# Patient Record
Sex: Male | Born: 2000 | Race: Black or African American | Hispanic: No | Marital: Single | Smoking: Never smoker
Health system: Southern US, Community
[De-identification: ages and names within clinical notes are randomized; demographics above are authoritative.]

## PROBLEM LIST (undated history)

## (undated) HISTORY — PX: HERNIA REPAIR: SHX51

---
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
## 2022-05-31 ENCOUNTER — Emergency Department (HOSPITAL_BASED_OUTPATIENT_CLINIC_OR_DEPARTMENT_OTHER)
Admission: EM | Admit: 2022-05-31 | Discharge: 2022-05-31 | Disposition: A | Discharge status: Home / Self Care | Payer: 59 | Attending: Emergency Medicine | Admitting: Emergency Medicine

## 2022-05-31 ENCOUNTER — Other Ambulatory Visit: Payer: Self-pay

## 2022-05-31 ENCOUNTER — Encounter (HOSPITAL_BASED_OUTPATIENT_CLINIC_OR_DEPARTMENT_OTHER): Payer: Self-pay | Admitting: Emergency Medicine

## 2022-05-31 DIAGNOSIS — Y92410 Unspecified street and highway as the place of occurrence of the external cause: Secondary | ICD-10-CM | POA: Diagnosis not present

## 2022-05-31 DIAGNOSIS — M545 Low back pain, unspecified: Secondary | ICD-10-CM | POA: Diagnosis not present

## 2022-05-31 MED ORDER — METHOCARBAMOL 500 MG PO TABS: 500.0000 mg | ORAL_TABLET | Freq: Two times a day (BID) | ORAL | 0 refills | Status: AC

## 2022-05-31 NOTE — Discharge Instructions (Addendum)
It was a pleasure taking care of you today!  You are prescribed Robaxin (muscle relaxer). Do not drive or operate heavy machinery while taking the muscle relaxer as it will make you sleepy/drowsy. You may take over the counter 600 mg Ibuprofen every 6 hours and alternate with 500 mg Tylenol every 6 hours as needed for pain for no more than 7 days. You may apply ice or heat to affected area for up to 15 minutes at a time. Ensure to place a barrier between your skin and the ice/heat. Return to the Emergency Department if you are experiencing increasing/worsening symptoms.
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
## 2024-03-13 ENCOUNTER — Ambulatory Visit: Payer: Self-pay | Admitting: Nurse Practitioner

## 2024-03-21 ENCOUNTER — Ambulatory Visit: Payer: Self-pay | Admitting: Family Medicine
